# Patient Record
Sex: Female | Born: 2000 | Race: Black or African American | Hispanic: No | Marital: Single | Smoking: Never smoker
Health system: Southern US, Community
[De-identification: ages and names within clinical notes are randomized; demographics above are authoritative.]

---
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
## 2021-09-15 DIAGNOSIS — W228XXA Striking against or struck by other objects, initial encounter: Secondary | ICD-10-CM | POA: Diagnosis not present

## 2021-09-15 DIAGNOSIS — Y9367 Activity, basketball: Secondary | ICD-10-CM | POA: Insufficient documentation

## 2021-09-15 DIAGNOSIS — S0990XA Unspecified injury of head, initial encounter: Secondary | ICD-10-CM | POA: Diagnosis present

## 2021-09-15 DIAGNOSIS — M542 Cervicalgia: Secondary | ICD-10-CM | POA: Insufficient documentation

## 2021-09-15 DIAGNOSIS — R001 Bradycardia, unspecified: Secondary | ICD-10-CM | POA: Diagnosis not present

## 2021-09-16 ENCOUNTER — Emergency Department (HOSPITAL_COMMUNITY)
Admission: EM | Admit: 2021-09-16 | Discharge: 2021-09-16 | Disposition: A | Discharge status: Home / Self Care | Payer: Medicaid Other | Attending: Emergency Medicine | Admitting: Emergency Medicine

## 2021-09-16 ENCOUNTER — Other Ambulatory Visit: Payer: Self-pay

## 2021-09-16 ENCOUNTER — Encounter (HOSPITAL_COMMUNITY): Payer: Self-pay | Admitting: Emergency Medicine

## 2021-09-16 ENCOUNTER — Emergency Department (HOSPITAL_COMMUNITY): Payer: Medicaid Other

## 2021-09-16 DIAGNOSIS — S0990XA Unspecified injury of head, initial encounter: Secondary | ICD-10-CM

## 2021-09-16 MED ORDER — NAPROXEN 500 MG PO TABS: 500.0000 mg | ORAL_TABLET | Freq: Two times a day (BID) | ORAL | 0 refills | Status: DC | PRN

## 2021-09-16 NOTE — ED Triage Notes (Signed)
Patient accidentally hit at back of head while playing basketball yesterday afternoon , denies LOC , reports occipital headache , patient added that her girlfriend told her that she stopped breathing this evening .

## 2021-09-16 NOTE — ED Provider Notes (Signed)
[facility] EMERGENCY DEPARTMENT Provider Note   CSN: [id number] Arrival date & time: 09/15/21  2136     History Chief Complaint  Patient presents with   Head Injury    Desiree Potter is a 20 y.o. female without significant past medical history who presents to the emergency department with complaints of headache and neck pain status post injury that occurred yesterday while she was playing basketball.  She states she was struck in the back of the head/neck, unsure which she was struck with, she states she saw spots and may have had loss of consciousness.  She has been having headache and neck pain with some intermittent blurry vision since.  No alleviating or aggravating factors.  She denies numbness, weakness, vomiting, or seizure activity.  HPI     History reviewed. No pertinent past medical history.  There are no problems to display for this patient.   History reviewed. No pertinent surgical history.   OB History   No obstetric history on file.     No family history on file.  Social History   Tobacco Use   Smoking status: Never   Smokeless tobacco: Never  Substance Use Topics   Alcohol use: Yes   Drug use: Never    Home Medications Prior to Admission medications   Medication Sig Start Date End Date Taking? Authorizing Provider  naproxen (NAPROSYN) 500 MG tablet Take 1 tablet (500 mg total) by mouth 2 (two) times daily as needed for moderate pain. 09/16/21  Yes [place] Hedman R, PA-C    Allergies    Patient has no known allergies.  Review of Systems   Review of Systems  Constitutional:  Negative for chills and fever.  Eyes:  Positive for visual disturbance.  Respiratory:  Negative for shortness of breath.   Cardiovascular:  Negative for chest pain.  Gastrointestinal:  Negative for abdominal pain.  Musculoskeletal:  Positive for neck pain. Negative for back pain.  Neurological:  Positive for headaches. Negative for seizures, weakness  and numbness.  All other systems reviewed and are negative.  Physical Exam Updated Vital Signs BP 128/79   Pulse (!) 45   Temp 97.9 F (36.6 C) (Oral)   Resp 16   Ht 6\' 1"  (1.854 m)   Wt 72 kg   LMP 08/18/2021   SpO2 100%   BMI 20.94 kg/m   Physical Exam Vitals and nursing note reviewed.  Constitutional:      General: She is not in acute distress.    Appearance: Normal appearance. She is not toxic-appearing.  HENT:     Head: Normocephalic and atraumatic.     Mouth/Throat:     Pharynx: Oropharynx is clear. Uvula midline.  Eyes:     General: Vision grossly intact. Gaze aligned appropriately.     Extraocular Movements: Extraocular movements intact.     Conjunctiva/sclera: Conjunctivae normal.     Pupils: Pupils are equal, round, and reactive to light.     Comments: No proptosis.   Cardiovascular:     Rate and Rhythm: Regular rhythm. Bradycardia present.  Pulmonary:     Effort: Pulmonary effort is normal.     Breath sounds: Normal breath sounds.  Abdominal:     General: There is no distension.     Palpations: Abdomen is soft.     Tenderness: There is no abdominal tenderness. There is no guarding or rebound.  Musculoskeletal:     Cervical back: Normal range of motion and neck supple. Tenderness (diffuse midline)  present. No rigidity.  Skin:    General: Skin is warm and dry.  Neurological:     Mental Status: She is alert.     Comments: Alert. Clear speech. No facial droop. CNIII-XII grossly intact. Bilateral upper and lower extremities' sensation grossly intact. 5/5 symmetric strength with grip strength and with plantar and dorsi flexion bilaterally . Normal finger to nose bilaterally. Negative pronator drift. Gait intact.    Psychiatric:        Mood and Affect: Mood normal.        Behavior: Behavior normal.    ED Results / Procedures / Treatments   Labs (all labs ordered are listed, but only abnormal results are displayed) Labs Reviewed - No data to  display  EKG None  Radiology CT Head Wo Contrast  Result Date: 09/16/2021 CLINICAL DATA:  Trauma. EXAM: CT HEAD WITHOUT CONTRAST CT CERVICAL SPINE WITHOUT CONTRAST TECHNIQUE: Multidetector CT imaging of the head and cervical spine was performed following the standard protocol without intravenous contrast. Multiplanar CT image reconstructions of the cervical spine were also generated. COMPARISON:  None. FINDINGS: CT HEAD FINDINGS Brain: No evidence of acute infarction, hemorrhage, hydrocephalus, extra-axial collection or mass lesion/mass effect. Vascular: No hyperdense vessel or unexpected calcification. Skull: Normal. Negative for fracture or focal lesion. Sinuses/Orbits: No acute finding. Other: None CT CERVICAL SPINE FINDINGS Alignment: No acute subluxation. There is reversal of normal cervical lordosis which may be positional or due to muscle spasm. Skull base and vertebrae: No acute fracture. Soft tissues and spinal canal: No prevertebral fluid or swelling. No visible canal hematoma. Disc levels:  No acute findings.  No degenerative changes. Upper chest: Negative. Other: None IMPRESSION: 1. Normal noncontrast CT of the brain. 2. No acute/traumatic cervical spine pathology. 3. Reversal of normal cervical lordosis which may be positional or due to muscle spasm. Electronically Signed   By: Elgie CollardArash  Radparvar M.D.   On: 09/16/2021 01:58   CT Cervical Spine Wo Contrast  Result Date: 09/16/2021 CLINICAL DATA:  Trauma. EXAM: CT HEAD WITHOUT CONTRAST CT CERVICAL SPINE WITHOUT CONTRAST TECHNIQUE: Multidetector CT imaging of the head and cervical spine was performed following the standard protocol without intravenous contrast. Multiplanar CT image reconstructions of the cervical spine were also generated. COMPARISON:  None. FINDINGS: CT HEAD FINDINGS Brain: No evidence of acute infarction, hemorrhage, hydrocephalus, extra-axial collection or mass lesion/mass effect. Vascular: No hyperdense vessel or unexpected  calcification. Skull: Normal. Negative for fracture or focal lesion. Sinuses/Orbits: No acute finding. Other: None CT CERVICAL SPINE FINDINGS Alignment: No acute subluxation. There is reversal of normal cervical lordosis which may be positional or due to muscle spasm. Skull base and vertebrae: No acute fracture. Soft tissues and spinal canal: No prevertebral fluid or swelling. No visible canal hematoma. Disc levels:  No acute findings.  No degenerative changes. Upper chest: Negative. Other: None IMPRESSION: 1. Normal noncontrast CT of the brain. 2. No acute/traumatic cervical spine pathology. 3. Reversal of normal cervical lordosis which may be positional or due to muscle spasm. Electronically Signed   By: Elgie CollardArash  Radparvar M.D.   On: 09/16/2021 01:58    Procedures Procedures   Medications Ordered in ED Medications - No data to display  ED Course  I have reviewed the triage vital signs and the nursing notes.  Pertinent labs & imaging results that were available during my care of the patient were reviewed by me and considered in my medical decision making (see chart for details).    MDM Rules/Calculators/A&P  Patient presents to the ED with complaints of headache and neck pain status post injury yesterday.  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   Additional history obtained:  Additional history obtained from nursing note review.   Imaging Studies ordered:  I ordered imaging studies which included CT head/Cspine, I independently reviewed, formal radiology impression shows:  1. Normal noncontrast CT of the brain. 2. No acute/traumatic cervical spine pathology. 3. Reversal of normal cervical lordosis which may be positional or due to muscle spasm.  ED Course:  No head bleed.  No cervical spine fracture or dislocation.  Patient without focal neurologic deficits on  exam.  She is ambulatory.  Suspect mild traumatic brain injury/concussion.  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 I discussed results, treatment plan, need for follow-up, and return precautions with the patient and her girlfriend at bedside.  Provided opportunity for questions, patient and her girlfriend confirmed understanding and are in agreement with plan.   Portions of this note were generated with Scientist, clinical (histocompatibility and immunogenetics). Dictation errors may occur despite best attempts at proofreading.  Final Clinical Impression(s) / ED Diagnoses Final diagnoses:  Injury of head, initial encounter    Rx / DC Orders ED Discharge Orders          Ordered    naproxen (NAPROSYN) 500 MG tablet  2 times daily PRN        09/16/21 0533             Cherly Anderson, PA-C 09/16/21 4825    Nira Conn, MD 09/16/21 [phone]

## 2021-09-16 NOTE — Discharge Instructions (Addendum)
You were seen in the emergency department today following a head injury.  We suspect that you have a concussion, otherwise known and as a mild traumatic brain injury.  Your  CT scan did not show any new abnormality such as a brain bleed.  We would like you to follow-up with the [facility], contact information below:  Address: [address], [place], [place] [address] Phone: [phone]  Per [facility] Website:   What to Expect: Evaluations at the Concussion Clinic All patients at the Concussion Clinic are given an extensive three-part evaluation that includes: a computerized test to measure memory, visual processing speed, and reaction time a test that measures the systems that integrate movement, balance, and vision an in-depth review of a detailed symptoms checklist for signs of concussion The evaluation process is critical for the treatment and recovery of a concussed patient as no two concussions are alike. Thankfully, the diagnostic tools that trained professionals use can help to better manage head injuries.  Part of the technology the doctors and staff at [facility] use in their assessments is a computerized examination called ImPACT. This tool uses six tasks to measure memory, visual processing speed, and reaction time. By analyzing the results of the examination and comparing them to average responses or a baseline score for a patient, our staff can make an informed judgment about the patient's cognitive functions. In addition to the ImPACT examination, patients are given a Vestibular Ocular Motor Screening test. This is a simple and painless test that focuses on the systems that integrate a patient's movement, balance, and vision.  These tests are used in conjunction with a thorough review of a detailed symptoms checklist to complete the patient's evaluation and develop a treatment plan.  You do not need a referral,  and you can book an appointment online. Our Concussion Hotline is staffed by trained professionals during our regular office hours: Monday - Thursday from 7:30 AM to 4:30 PM, and Fridays from 7:30 AM to 12:00 PM, and the number to call is [phone]. The Concussion Clinic team meets with patients at our [facility] office. Call today.   Further ED Instructions:   Please call and follow-up within the concussion clinic as well as your primary care provider within the next 3 to 5 days.  In the meantime we would like you to avoid strenuous/over exertional activities such as sports or running.  Please avoid excess screen time utilizing cell phones, computers, or the TV.  Please avoid activities that require significant amount of concentration.  Please try to rest as much as possible. We are sending you home with a prescription for naproxen.   - Naproxen is a nonsteroidal anti-inflammatory medication that will help with pain and swelling. Be sure to take this medication as prescribed with food, 1 pill every 12 hours,  It should be taken with food, as it can cause stomach upset, and more seriously, stomach bleeding. Do not take other nonsteroidal anti-inflammatory medications with this such as Advil, Motrin, Aleve, Mobic, Goodie Powder, or Motrin.    You make take Tylenol per over the counter dosing with these medications.   We have prescribed you new medication(s) today. Discuss the medications prescribed today with your pharmacist as they can have adverse effects and interactions with your other medicines including over the counter and prescribed medications. Seek medical evaluation if you start to experience new or abnormal symptoms after taking one of these medicines, seek care  immediately if you start to experience difficulty breathing, feeling of your throat closing, facial swelling, or rash as these could be indications of a more serious allergic reaction    Return to the ER for new or worsening  symptoms or any other concerns that you may have.
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
## 2022-01-13 ENCOUNTER — Other Ambulatory Visit: Payer: Self-pay

## 2022-01-13 ENCOUNTER — Encounter (HOSPITAL_COMMUNITY): Payer: Self-pay

## 2022-01-13 ENCOUNTER — Emergency Department (HOSPITAL_COMMUNITY): Payer: BC Managed Care – PPO

## 2022-01-13 ENCOUNTER — Emergency Department (HOSPITAL_COMMUNITY)
Admission: EM | Admit: 2022-01-13 | Discharge: 2022-01-13 | Disposition: A | Discharge status: Home / Self Care | Payer: BC Managed Care – PPO | Attending: Emergency Medicine | Admitting: Emergency Medicine

## 2022-01-13 DIAGNOSIS — Y9289 Other specified places as the place of occurrence of the external cause: Secondary | ICD-10-CM | POA: Insufficient documentation

## 2022-01-13 DIAGNOSIS — Z23 Encounter for immunization: Secondary | ICD-10-CM | POA: Diagnosis not present

## 2022-01-13 DIAGNOSIS — S59911A Unspecified injury of right forearm, initial encounter: Secondary | ICD-10-CM | POA: Diagnosis present

## 2022-01-13 DIAGNOSIS — W228XXA Striking against or struck by other objects, initial encounter: Secondary | ICD-10-CM | POA: Diagnosis not present

## 2022-01-13 DIAGNOSIS — M79641 Pain in right hand: Secondary | ICD-10-CM

## 2022-01-13 DIAGNOSIS — T22111A Burn of first degree of right forearm, initial encounter: Secondary | ICD-10-CM | POA: Insufficient documentation

## 2022-01-13 DIAGNOSIS — T310 Burns involving less than 10% of body surface: Secondary | ICD-10-CM | POA: Insufficient documentation

## 2022-01-13 MED ORDER — TETANUS-DIPHTH-ACELL PERTUSSIS 5-2.5-18.5 LF-MCG/0.5 IM SUSY
0.5000 mL | PREFILLED_SYRINGE | Freq: Once | INTRAMUSCULAR | Status: AC
  Administered 2022-01-13: 0.5 mL via INTRAMUSCULAR
  Filled 2022-01-13: qty 0.5

## 2022-01-13 MED ORDER — ALBUTEROL SULFATE HFA 108 (90 BASE) MCG/ACT IN AERS: 1.0000 | INHALATION_SPRAY | Freq: Four times a day (QID) | RESPIRATORY_TRACT | 0 refills | Status: AC | PRN
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
## 2022-01-15 ENCOUNTER — Other Ambulatory Visit: Payer: Self-pay

## 2022-01-15 ENCOUNTER — Emergency Department (HOSPITAL_COMMUNITY): Payer: BC Managed Care – PPO

## 2022-01-15 ENCOUNTER — Emergency Department (HOSPITAL_COMMUNITY)
Admission: EM | Admit: 2022-01-15 | Discharge: 2022-01-15 | Disposition: A | Discharge status: Home / Self Care | Payer: BC Managed Care – PPO | Attending: Emergency Medicine | Admitting: Emergency Medicine

## 2022-01-15 ENCOUNTER — Encounter (HOSPITAL_COMMUNITY): Payer: Self-pay

## 2022-01-15 DIAGNOSIS — M25531 Pain in right wrist: Secondary | ICD-10-CM

## 2022-01-15 DIAGNOSIS — W2203XA Walked into furniture, initial encounter: Secondary | ICD-10-CM | POA: Diagnosis not present

## 2022-01-15 DIAGNOSIS — S60221A Contusion of right hand, initial encounter: Secondary | ICD-10-CM | POA: Diagnosis not present

## 2022-01-15 DIAGNOSIS — S6991XA Unspecified injury of right wrist, hand and finger(s), initial encounter: Secondary | ICD-10-CM | POA: Diagnosis present

## 2022-01-15 NOTE — Discharge Instructions (Signed)
Please read and follow all provided instructions. ? ?Your diagnoses today include:  ?1. Contusion of right hand, initial encounter   ?2. Right wrist pain   ? ? ?Tests performed today include: ?An x-ray of the affected area - does NOT show any broken bones ?Vital signs. See below for your results today.  ? ?Medications prescribed:  ?Please use over-the-counter NSAID medications (ibuprofen, naproxen) as directed on the packaging for pain if you do not have any reasons not to take these medications just as weak kidneys or a history of bleeding in your stomach or gut.  ? ?Take any prescribed medications only as directed. ? ?Home care instructions:  ?Follow any educational materials contained in this packet ?Follow R.I.C.E. Protocol: ?R - rest your injury ? I  - use ice on injury without applying directly to skin ?C - compress injury with bandage or splint ?E - elevate the injury as much as possible ? ?Follow-up instructions: ?Please follow-up with your primary care provider if you continue to have significant pain in 1 week. In this case you may have a more severe injury that requires further care.  ? ?Return instructions:  ?Please return if your fingers are numb or tingling, appear gray or blue, or you have severe pain (also elevate the arm and loosen splint or wrap if you were given one) ?Please return to the Emergency Department if you experience worsening symptoms.  ?Please return if you have any other emergent concerns. ? ?Additional Information: ? ?Your vital signs today were: ?BP (!) 141/94 (BP Location: Left Arm)   Pulse 71   Temp 98 ?F (36.7 ?C) (Oral)   Resp 18   Ht 6\' 1"  (1.854 m)   Wt 63.5 kg   LMP 12/31/2021   SpO2 98%   BMI 18.47 kg/m?  ?If your blood pressure (BP) was elevated above 135/85 this visit, please have this repeated by your doctor within one month. ?-------------- ? ? ?
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
## 2022-03-12 ENCOUNTER — Emergency Department (HOSPITAL_COMMUNITY): Payer: BC Managed Care – PPO

## 2022-03-12 ENCOUNTER — Other Ambulatory Visit: Payer: Self-pay

## 2022-03-12 ENCOUNTER — Emergency Department (HOSPITAL_COMMUNITY)
Admission: EM | Admit: 2022-03-12 | Discharge: 2022-03-12 | Disposition: A | Discharge status: Home / Self Care | Payer: BC Managed Care – PPO | Attending: Emergency Medicine | Admitting: Emergency Medicine

## 2022-03-12 ENCOUNTER — Encounter (HOSPITAL_COMMUNITY): Payer: Self-pay

## 2022-03-12 DIAGNOSIS — S60511A Abrasion of right hand, initial encounter: Secondary | ICD-10-CM | POA: Insufficient documentation

## 2022-03-12 DIAGNOSIS — S6991XA Unspecified injury of right wrist, hand and finger(s), initial encounter: Secondary | ICD-10-CM | POA: Diagnosis present

## 2022-03-12 DIAGNOSIS — W25XXXA Contact with sharp glass, initial encounter: Secondary | ICD-10-CM | POA: Diagnosis not present

## 2022-03-12 DIAGNOSIS — S60221A Contusion of right hand, initial encounter: Secondary | ICD-10-CM

## 2022-03-12 MED ORDER — IBUPROFEN 800 MG PO TABS
800.0000 mg | ORAL_TABLET | Freq: Once | ORAL | Status: AC
  Administered 2022-03-12: 800 mg via ORAL
  Filled 2022-03-12: qty 1

## 2022-03-12 NOTE — Discharge Instructions (Signed)
You have a contusion and abrasion of the right hand.  There is no fracture on the x-ray right now. ? ?Take Tylenol or Motrin for pain and apply ice for swelling. ? ?Return to ER if you have worse hand swelling, uncontrolled bleeding ?
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
## 2022-04-17 ENCOUNTER — Emergency Department (HOSPITAL_COMMUNITY)
Admission: EM | Admit: 2022-04-17 | Discharge: 2022-04-17 | Disposition: A | Discharge status: Home / Self Care | Payer: BC Managed Care – PPO | Attending: Emergency Medicine | Admitting: Emergency Medicine

## 2022-04-17 ENCOUNTER — Emergency Department (HOSPITAL_COMMUNITY): Payer: BC Managed Care – PPO

## 2022-04-17 ENCOUNTER — Encounter (HOSPITAL_COMMUNITY): Payer: Self-pay | Admitting: Emergency Medicine

## 2022-04-17 DIAGNOSIS — W228XXA Striking against or struck by other objects, initial encounter: Secondary | ICD-10-CM | POA: Insufficient documentation

## 2022-04-17 DIAGNOSIS — M79674 Pain in right toe(s): Secondary | ICD-10-CM | POA: Diagnosis present

## 2022-04-17 MED ORDER — IBUPROFEN 600 MG PO TABS: 600.0000 mg | ORAL_TABLET | Freq: Four times a day (QID) | ORAL | 0 refills | Status: DC | PRN

## 2022-04-17 MED ORDER — IBUPROFEN 800 MG PO TABS
800.0000 mg | ORAL_TABLET | Freq: Once | ORAL | Status: AC
  Administered 2022-04-17: 800 mg via ORAL
  Filled 2022-04-17: qty 1

## 2022-04-17 MED ORDER — IBUPROFEN 800 MG PO TABS: 800.0000 mg | ORAL_TABLET | Freq: Three times a day (TID) | ORAL | 0 refills | Status: DC

## 2022-04-17 NOTE — Discharge Instructions (Addendum)
Take ibuprofen every 6 hours for the next 7 days.  Keep foot in hard soled shoe for 7 to 10 days and reassess pain level.  Rest, ice, elevate, compress affected toe.  I will provide information follow-up with orthopedics if you need or if your symptoms do not get better or get worse.  Also provide a work note as requested.  Please do not hesitate to return to the emergency department for worrisome signs and symptoms we discussed become apparent.

## 2022-04-17 NOTE — ED Provider Notes (Signed)
[facility]-EMERGENCY DEPT Provider Note   CSN: [id number] Arrival date & time: 04/17/22  1348     History  Chief Complaint  Patient presents with   Toe Pain    Desiree Potter is a 21 y.o. female.   Toe Pain Pertinent negatives include no chest pain, no abdominal pain and no shortness of breath.  21 year old female presents emergency department with right great toe pain.  Patient states that she was kicking a ball yesterday when she missed the ball and kicked a wall.  Her toe proceeded to swell and become bruised.  She has been able to ambulate but with pain.  She has tried to take one 800 mg of Advil since the accident with some relief.  She denies weakness, sensory deficits. Denies fever, chills, night sweats, chest pain, shortness of breath, abdominal pain, N/V/D, urinary/vaginal symptoms, change in bowel habits.  History reviewed. No pertinent past medical history.  History reviewed. No pertinent surgical history.  Home Medications Prior to Admission medications   Medication Sig Start Date End Date Taking? Authorizing Provider  ibuprofen (ADVIL) 600 MG tablet Take 1 tablet (600 mg total) by mouth every 6 (six) hours as needed. 04/17/22  Yes Sherian Maroonobbins, Nirali Magouirk A, PA  albuterol (VENTOLIN HFA) 108 (90 Base) MCG/ACT inhaler Inhale 1-2 puffs into the lungs every 6 (six) hours as needed for wheezing or shortness of breath. 01/13/22   Haskel SchroederBadalamente, Peter R, PA-C  naproxen (NAPROSYN) 500 MG tablet Take 1 tablet (500 mg total) by mouth 2 (two) times daily as needed for moderate pain. 09/16/21   Petrucelli, Pleas KochSamantha R, PA-C      Allergies    Patient has no known allergies.    Review of Systems   Review of Systems  Constitutional:  Negative for chills and fever.  HENT:  Negative for ear pain and sore throat.   Eyes:  Negative for pain and visual disturbance.  Respiratory:  Negative for cough and shortness of breath.   Cardiovascular:  Negative for chest pain and  palpitations.  Gastrointestinal:  Negative for abdominal pain and vomiting.  Genitourinary:  Negative for dysuria and hematuria.  Musculoskeletal:  Positive for arthralgias. Negative for back pain.       Right great toe pain and swelling.  Skin:  Negative for color change and rash.  Neurological:  Negative for seizures and syncope.  All other systems reviewed and are negative.   Physical Exam Updated Vital Signs BP 115/64   Pulse (!) 51   Temp 98.1 F (36.7 C)   Resp 16   SpO2 99%  Physical Exam Vitals and nursing note reviewed.  Constitutional:      General: She is not in acute distress.    Appearance: She is well-developed.  HENT:     Head: Normocephalic and atraumatic.     Mouth/Throat:     Mouth: Mucous membranes are moist.     Pharynx: Oropharynx is clear.  Eyes:     Extraocular Movements: Extraocular movements intact.     Conjunctiva/sclera: Conjunctivae normal.  Cardiovascular:     Rate and Rhythm: Normal rate and regular rhythm.     Heart sounds: No murmur heard. Pulmonary:     Effort: Pulmonary effort is normal. No respiratory distress.     Breath sounds: Normal breath sounds.  Abdominal:     General: Abdomen is flat.     Palpations: Abdomen is soft.     Tenderness: There is no abdominal tenderness.  Musculoskeletal:  Cervical back: Normal range of motion and neck supple.     Left foot: Normal.     Comments: Swelling located along the length of the great toe.  Ecchymosis noted at the base.  Minimal swelling extending into the forefoot.  Tenderness to palpation along the aspect of the great toe.  Minimal active range of motion in flexion and extension of great toe secondary to pain.  Patient has full range of motion of ankles bilaterally.  Posterior tibial pulses full and intact bilaterally.  No sensory deficits along major nerve distributions of lower extremities.  Muscle strength 5 out of 5.  Cap refill less than 2 seconds on affected digit  Skin:     General: Skin is warm and dry.     Capillary Refill: Capillary refill takes less than 2 seconds.  Neurological:     Mental Status: She is alert.  Psychiatric:        Mood and Affect: Mood normal.     ED Results / Procedures / Treatments   Labs (all labs ordered are listed, but only abnormal results are displayed) Labs Reviewed - No data to display  EKG None  Radiology DG Foot Complete Right  Result Date: 04/17/2022 CLINICAL DATA:  Right great toe pain EXAM: RIGHT FOOT COMPLETE - 3+ VIEW COMPARISON:  None Available. FINDINGS: There is no evidence of fracture or dislocation. There is no evidence of arthropathy or other focal bone abnormality. Soft tissues are unremarkable. IMPRESSION: No fracture or dislocation of the right foot. Joint spaces are well preserved. No radiographic findings to explain pain. Electronically Signed   By: Jearld Lesch M.D.   On: 04/17/2022 14:50    Procedures Procedures    Medications Ordered in ED Medications  ibuprofen (ADVIL) tablet 800 mg (800 mg Oral Given 04/17/22 1459)    ED Course/ Medical Decision Making/ A&P                           Medical Decision Making Amount and/or Complexity of Data Reviewed Radiology: ordered.  Risk Prescription drug management.   This patient presents to the ED for concern of great toe pain, this involves an extensive number of treatment options, and is a complaint that carries with it a high risk of complications and morbidity.  The differential diagnosis includes fracture, contusion, osteomyelitis, gout, RA,   Co morbidities that complicate the patient evaluation  N/a   Additional history obtained:  Additional history obtained from friend who is at bedside   Lab Tests:  N/a   Imaging Studies ordered:  I ordered imaging studies including right foot x-ray I independently visualized and interpreted imaging which showed no acute abnormality I agree with the radiologist interpretation   Cardiac  Monitoring: / EKG:  The patient was maintained on a cardiac monitor.  I personally viewed and interpreted the cardiac monitored which showed an underlying rhythm of: Sinus rhythm   Consultations Obtained:  N/a   Problem List / ED Course / Critical interventions / Medication management  Toe pain I ordered medication including Motrin for pain   Reevaluation of the patient after these medicines showed that the patient improved I have reviewed the patients home medicines and have made adjustments as needed   Social Determinants of Health:  Denies tobacco, alcohol, illicit drug use   Test / Admission - Considered:  Toe pain Vitals signs within normal range and stable throughout visit. imaging studies significant for: No acute abnormality  Likely secondary to turf toe given negative injury with likely fracture.  Recommend symptomatic therapy with rest, ice, compression, elevation, NSAIDs for the next 7 days and get response.  Hard soled shoe also provided to help with symptomatic control.  Ortho follow-up in 7 to 10 days if symptoms do not get better or get worse. Worrisome signs and symptoms were discussed with the patient, and the patient acknowledged understanding to return to the ED if noticed. Patient was stable upon discharge.          Final Clinical Impression(s) / ED Diagnoses Final diagnoses:  Toe pain, right    Rx / DC Orders ED Discharge Orders          Ordered    ibuprofen (ADVIL) 800 MG tablet  3 times daily,   Status:  Discontinued        04/17/22 1454    ibuprofen (ADVIL) 600 MG tablet  Every 6 hours PRN        04/17/22 1509              Peter Garter, [place] 04/17/22 1516    Milagros Loll, MD 04/19/22 [phone]

## 2022-04-17 NOTE — ED Triage Notes (Signed)
Patient c/o R great toe pain after hitting it on wall x2 days ago. Bruising noted to toe.

## 2022-04-17 NOTE — Progress Notes (Signed)
Orthopedic Tech Progress Note Patient Details:  Desiree Potter 12/09/2000 [id number]  Ortho Devices Type of Ortho Device: Postop shoe/boot Ortho Device/Splint Location: RLE Ortho Device/Splint Interventions: Application   Post Interventions Patient Tolerated: Well  [name] [name] 04/17/2022, 3:05 PM

## 2022-07-06 ENCOUNTER — Ambulatory Visit (HOSPITAL_COMMUNITY)
Admission: EM | Admit: 2022-07-06 | Discharge: 2022-07-06 | Disposition: A | Discharge status: Home / Self Care | Payer: BC Managed Care – PPO | Attending: Internal Medicine | Admitting: Internal Medicine

## 2022-07-06 ENCOUNTER — Encounter (HOSPITAL_COMMUNITY): Payer: Self-pay

## 2022-07-06 ENCOUNTER — Ambulatory Visit (INDEPENDENT_AMBULATORY_CARE_PROVIDER_SITE_OTHER): Payer: BC Managed Care – PPO

## 2022-07-06 DIAGNOSIS — R079 Chest pain, unspecified: Secondary | ICD-10-CM | POA: Diagnosis not present

## 2022-07-06 DIAGNOSIS — R0789 Other chest pain: Secondary | ICD-10-CM

## 2022-07-06 DIAGNOSIS — R059 Cough, unspecified: Secondary | ICD-10-CM | POA: Diagnosis not present

## 2022-07-06 DIAGNOSIS — R509 Fever, unspecified: Secondary | ICD-10-CM | POA: Diagnosis not present

## 2022-07-06 MED ORDER — KETOROLAC TROMETHAMINE 30 MG/ML IJ SOLN
INTRAMUSCULAR | Status: AC
  Filled 2022-07-06: qty 1

## 2022-07-06 MED ORDER — KETOROLAC TROMETHAMINE 30 MG/ML IJ SOLN
15.0000 mg | Freq: Once | INTRAMUSCULAR | Status: AC
  Administered 2022-07-06: 15 mg via INTRAVENOUS

## 2022-07-06 MED ORDER — IBUPROFEN 600 MG PO TABS: 600.0000 mg | ORAL_TABLET | Freq: Four times a day (QID) | ORAL | 0 refills | Status: AC | PRN

## 2022-07-06 NOTE — Discharge Instructions (Signed)
Heating pad use on a 20-minute on-20 minutes off cycle Gentle stretching exercises Please take medications as prescribed Chest x-ray is negative for pneumonia Return to urgent care if symptoms worsen.

## 2022-07-06 NOTE — ED Triage Notes (Signed)
Pt is here for cough, night sweats, dizziness possible fever spasms under rib cage on right side

## 2022-07-07 NOTE — ED Provider Notes (Signed)
[facility]    CSN: [id number] Arrival date & time: 07/06/22  1410      History   Chief Complaint Chief Complaint  Patient presents with   Cough    HPI Desiree Potter is a 21 y.o. female comes to the urgent care with fairly sudden onset right-sided chest pain of 1 day duration.  Pain is sharp, aggravated by movement and taking a deep breath.  No known relieving factors.  No fall.  Patient has a cough,some night sweats and dizziness.  She denies subjective fever or chills.  No weight loss.  Patient denies any sore throat, runny nose, nausea vomiting or diarrhea.  No sick contacts.  Patient denies any heavy lifting.   HPI  History reviewed. No pertinent past medical history.  There are no problems to display for this patient.   History reviewed. No pertinent surgical history.  OB History   No obstetric history on file.      Home Medications    Prior to Admission medications   Medication Sig Start Date End Date Taking? Authorizing Provider  ibuprofen (ADVIL) 600 MG tablet Take 1 tablet (600 mg total) by mouth every 6 (six) hours as needed. 07/06/22  Yes Elie Leppo, Britta Mccreedy, MD  albuterol (VENTOLIN HFA) 108 (90 Base) MCG/ACT inhaler Inhale 1-2 puffs into the lungs every 6 (six) hours as needed for wheezing or shortness of breath. 01/13/22   Haskel Schroeder, PA-C    Family History History reviewed. No pertinent family history.  Social History Social History   Tobacco Use   Smoking status: Never   Smokeless tobacco: Never  Substance Use Topics   Alcohol use: Yes   Drug use: Never     Allergies   Patient has no allergy information on record.   Review of Systems Review of Systems As per HPI  Physical Exam Triage Vital Signs ED Triage Vitals  Enc Vitals Group     BP 07/06/22 1510 130/75     Pulse Rate 07/06/22 1510 82     Resp 07/06/22 1510 16     Temp 07/06/22 1510 98.4 F (36.9 C)     Temp Source 07/06/22 1510 Oral     SpO2 07/06/22 1510  98 %     Weight 07/06/22 1509 140 lb (63.5 kg)     Height 07/06/22 1509 6\' 2"  (1.88 m)     Head Circumference --      Peak Flow --      Pain Score 07/06/22 1508 8     Pain Loc --      Pain Edu? --      Excl. in GC? --    No data found.  Updated Vital Signs BP 130/75 (BP Location: Right Arm)   Pulse 82   Temp 98.4 F (36.9 C) (Oral)   Resp 16   Ht 6\' 2"  (1.88 m)   Wt 63.5 kg   LMP  (LMP Unknown)   SpO2 98%   BMI 17.97 kg/m   Visual Acuity Right Eye Distance:   Left Eye Distance:   Bilateral Distance:    Right Eye Near:   Left Eye Near:    Bilateral Near:     Physical Exam Vitals and nursing note reviewed.  Constitutional:      General: She is in acute distress.     Appearance: She is not ill-appearing.  Cardiovascular:     Rate and Rhythm: Normal rate and regular rhythm.     Pulses: Normal  pulses.     Heart sounds: Normal heart sounds.  Pulmonary:     Effort: Pulmonary effort is normal.     Breath sounds: Normal breath sounds.  Abdominal:     General: Bowel sounds are normal.     Palpations: Abdomen is soft.  Skin:    General: Skin is warm.  Neurological:     Mental Status: She is alert.      UC Treatments / Results  Labs (all labs ordered are listed, but only abnormal results are displayed) Labs Reviewed - No data to display  EKG   Radiology DG Chest 2 View  Result Date: 07/06/2022 CLINICAL DATA:  Chest pain, fever, cough EXAM: CHEST - 2 VIEW COMPARISON:  08/17/2019 FINDINGS: The heart size and mediastinal contours are within normal limits. Both lungs are clear. The visualized skeletal structures are unremarkable. IMPRESSION: No active cardiopulmonary disease. Electronically Signed   By: Ernie Avena M.D.   On: 07/06/2022 15:38    Procedures Procedures (including critical care time)  Medications Ordered in UC Medications  ketorolac (TORADOL) 30 MG/ML injection 15 mg (15 mg Intravenous Given 07/06/22 1609)    Initial Impression /  Assessment and Plan / UC Course  I have reviewed the triage vital signs and the nursing notes.  Pertinent labs & imaging results that were available during my care of the patient were reviewed by me and considered in my medical decision making (see chart for details).     1.  Chest wall pain: Toradol 15 mg IM x1 dose Chest x-ray is negative for acute lung infiltrates Ibuprofen as needed for pain Heating pad use as directed Gentle stretching exercises Maintain adequate hydration Return precautions given. Final Clinical Impressions(s) / UC Diagnoses   Final diagnoses:  Chest wall pain     Discharge Instructions      Heating pad use on a 20-minute on-20 minutes off cycle Gentle stretching exercises Please take medications as prescribed Chest x-ray is negative for pneumonia Return to urgent care if symptoms worsen.   ED Prescriptions     Medication Sig Dispense Auth. Provider   ibuprofen (ADVIL) 600 MG tablet Take 1 tablet (600 mg total) by mouth every 6 (six) hours as needed. 30 tablet Liz Pinho, Britta Mccreedy, MD      PDMP not reviewed this encounter.   Merrilee Jansky, MD 07/07/22 [phone]

## 2022-08-19 ENCOUNTER — Encounter (HOSPITAL_BASED_OUTPATIENT_CLINIC_OR_DEPARTMENT_OTHER): Payer: Self-pay

## 2022-08-19 ENCOUNTER — Emergency Department (HOSPITAL_BASED_OUTPATIENT_CLINIC_OR_DEPARTMENT_OTHER): Payer: BC Managed Care – PPO | Admitting: Radiology

## 2022-08-19 ENCOUNTER — Emergency Department (HOSPITAL_BASED_OUTPATIENT_CLINIC_OR_DEPARTMENT_OTHER)
Admission: EM | Admit: 2022-08-19 | Discharge: 2022-08-19 | Disposition: A | Discharge status: Home / Self Care | Payer: BC Managed Care – PPO | Attending: Emergency Medicine | Admitting: Emergency Medicine

## 2022-08-19 ENCOUNTER — Other Ambulatory Visit: Payer: Self-pay

## 2022-08-19 DIAGNOSIS — X58XXXA Exposure to other specified factors, initial encounter: Secondary | ICD-10-CM | POA: Diagnosis not present

## 2022-08-19 DIAGNOSIS — S60221A Contusion of right hand, initial encounter: Secondary | ICD-10-CM | POA: Insufficient documentation

## 2022-08-19 DIAGNOSIS — S6991XA Unspecified injury of right wrist, hand and finger(s), initial encounter: Secondary | ICD-10-CM | POA: Diagnosis present

## 2022-08-19 NOTE — ED Triage Notes (Signed)
Patient here POV from School.  Endorses Punching a Wall and is now having Swelling to Right Hand (Proximal Fifth Digit).  NAD Noted during Triage. A&Ox4. GCS 15. Ambulatory.

## 2022-08-19 NOTE — Discharge Instructions (Addendum)
You were seen in the ED regarding injury to your right hand.  Your x-rays are reassuring for no obvious fracture.  Your hand was wrapped with an Ace wrap to help with swelling and pain.  You can take over-the-counter Tylenol or ibuprofen as needed for pain and swelling.  You can also apply ice to the area, I recommend for no longer than 20 minutes at a time.  If your symptoms do not improve please follow-up with PCP.  I recommend avoiding overuse of your hand or playing sports.  I have also included information for the behavioral health center and behavioral health urgent care.  I highly recommend following up with behavioral health regarding how you have been feeling lately.  You can walk into the behavioral health urgent care and receive care at any time without an appointment.  Return to the ED if you develop new or worsening symptoms.

## 2022-08-19 NOTE — ED Provider Notes (Signed)
[facility] EMERGENCY DEPT Provider Note   CSN: [id number] Arrival date & time: 08/19/22  1441     History  Chief Complaint  Patient presents with   Hand Injury    Desiree Potter is a 21 y.o. female presents to the ED with right hand injury after punching a concrete wall.  Patient states she was upset due to finding out her girlfriend was cheating on her.  This led to patient punching the wall 1 time.  She complains of pain to her right hand at the fifth digit and proximally.  Denies numbness, tingling, wound, or other injury.  She states she has been having mental health problems that lead her to self-harm by cutting.  She admits cutting her right thigh today.  Denies cutting through skin or bleeding in these areas.  She is not currently seeing anyone with behavioral health and has not discussed these problems with her PCP.  Patient is a Archivist.  Denies SI/HI.  Denies drug or alcohol use.      Home Medications Prior to Admission medications   Medication Sig Start Date End Date Taking? Authorizing Provider  albuterol (VENTOLIN HFA) 108 (90 Base) MCG/ACT inhaler Inhale 1-2 puffs into the lungs every 6 (six) hours as needed for wheezing or shortness of breath. 01/13/22   Haskel Schroeder, PA-C  ibuprofen (ADVIL) 600 MG tablet Take 1 tablet (600 mg total) by mouth every 6 (six) hours as needed. 07/06/22   Lamptey, Britta Mccreedy, MD      Allergies    Patient has no known allergies.    Review of Systems   Review of Systems  Musculoskeletal:  Positive for joint swelling.       Pain to the right hand proximal to 5th digit  Skin:  Negative for wound.  Neurological:  Negative for numbness.  Psychiatric/Behavioral:  Positive for self-injury. Negative for behavioral problems, hallucinations and suicidal ideas. The patient is nervous/anxious.     Physical Exam Updated Vital Signs BP 119/62 (BP Location: Right Arm)   Pulse 82   Temp 98.4 F (36.9 C) (Temporal)    Resp 18   Ht 6\' 2"  (1.88 m)   Wt 63.5 kg   SpO2 100%   BMI 17.97 kg/m  Physical Exam Vitals and nursing note reviewed.  Constitutional:      General: She is not in acute distress.    Appearance: Normal appearance. She is not ill-appearing or diaphoretic.  Cardiovascular:     Rate and Rhythm: Normal rate and regular rhythm.  Pulmonary:     Effort: Pulmonary effort is normal.  Musculoskeletal:     Right hand: Swelling, tenderness and bony tenderness present. No deformity or lacerations. Normal strength. Normal capillary refill. Normal pulse.     Left hand: Normal.     Comments: Tenderness over 5th metacarpal and MCP joint with swelling.  Skin:    General: Skin is warm and dry.     Capillary Refill: Capillary refill takes less than 2 seconds.     Findings: Signs of injury present.     Comments: Superficial scratches in lines to anterior surface of right thigh.  No bleeding present. Lines of scars to left forearm.  Neurological:     Mental Status: She is alert. Mental status is at baseline.  Psychiatric:        Attention and Perception: Attention normal.        Mood and Affect: Mood is anxious. Affect is tearful.  Speech: Speech normal.        Behavior: Behavior normal. Behavior is cooperative.        Thought Content: Thought content normal. Thought content does not include homicidal or suicidal ideation. Thought content does not include homicidal or suicidal plan.     Comments: She is upset and tearful and states her feelings are due to problems in her relationship.       ED Results / Procedures / Treatments   Labs (all labs ordered are listed, but only abnormal results are displayed) Labs Reviewed - No data to display  EKG None  Radiology DG Hand Complete Right  Result Date: 08/19/2022 CLINICAL DATA:  Right hand swelling after punching a wall. EXAM: RIGHT HAND - COMPLETE 3+ VIEW COMPARISON:  Radiographs 03/12/2022. FINDINGS: The mineralization and alignment are  normal. There is no evidence of acute fracture or dislocation. The joint spaces are preserved. Soft tissue swelling again noted over the 5th metacarpal phalangeal joint. No foreign body or soft tissue emphysema. IMPRESSION: No acute osseous findings. Soft tissue swelling over the 5th MCP joint. Electronically Signed   By: Richardean Sale M.D.   On: 08/19/2022 15:16    Procedures Procedures    Medications Ordered in ED Medications - No data to display  ED Course/ Medical Decision Making/ A&P                           Medical Decision Making  Patient presents to ED with injury to the right hand and tenderness following punching a concrete wall.  She has been experiencing behavioral health problems that lead her to self-harm as well as react and punch walls.  She is not currently under the care of behavioral health or a counselor.  Denies SI/HI.  Differential diagnoses of injury to right hand include but are not limited to boxer's fracture, dislocation, soft tissue injury.   Patient exam shows exquisite tenderness over the fifth metacarpal and fifth MCP joint of the right hand.  Patient is able to make a fist and ROM is intact for all digits and wrist.  Swelling and ecchymosis present over the fifth MCP joint of the right hand.  Spoke with patient about mental health problems that led to injury today.  Patient admits to self-harm but continues to deny SI/HI.  Evidence of self-harm present to right thigh and scars were present on left forearm.  I ordered and personally reviewed images of right hand.  No evidence of osseous fracture.  Soft tissue swelling over fifth MCP.  I agree with radiologist interpretation.  Plan is to discharge patient with Ace wrap on right hand and wrist.  Will provide patient instructions on caring for injury, using ice for swelling, and use of NSAIDs.  Will provide patient with behavioral health resources and information regarding behavioral health urgent care.  I do feel  because patient continues to deny SI/HI, has appropriate behavior, and normal thought content, it is appropriate to discharge her at this time with information on resources.  I strongly recommend patient to follow-up with one of the services or counseling at school.  Strict return precautions were given.          Final Clinical Impression(s) / ED Diagnoses Final diagnoses:  Contusion of right hand, initial encounter    Rx / DC Orders ED Discharge Orders     None         Pat Kocher, [place] 08/19/22 1904  Derwood Kaplan, MD 08/27/22 [phone]

## 2022-08-19 NOTE — ED Notes (Signed)
Reviewed AVS/discharge instruction with patient. Time allotted for and all questions answered. Patient is agreeable for d/c and escorted to ed exit by staff.  

## 2022-08-27 IMAGING — CR DG FOOT COMPLETE 3+V*R*
3 series · 3 of 3 positions shown · non-contrast
Comparison: None Available.

CLINICAL DATA: Right great toe pain

EXAM:
RIGHT FOOT COMPLETE - 3+ VIEW

[x foot ap right]
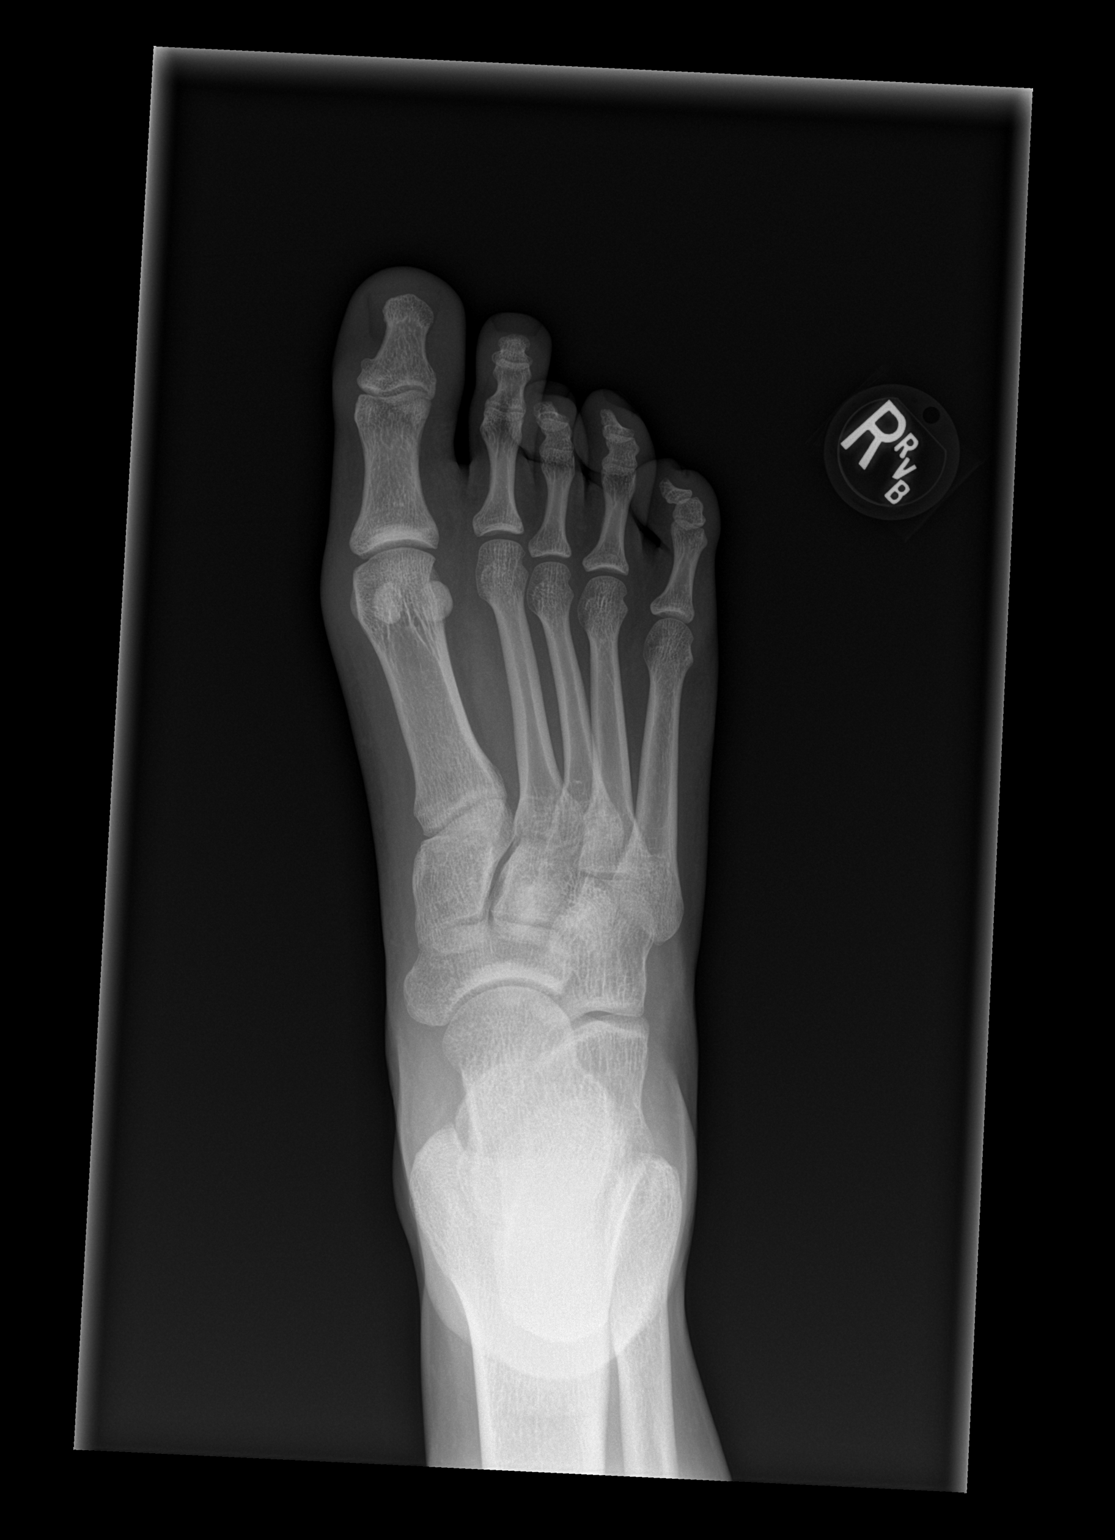

[x foot obl right]
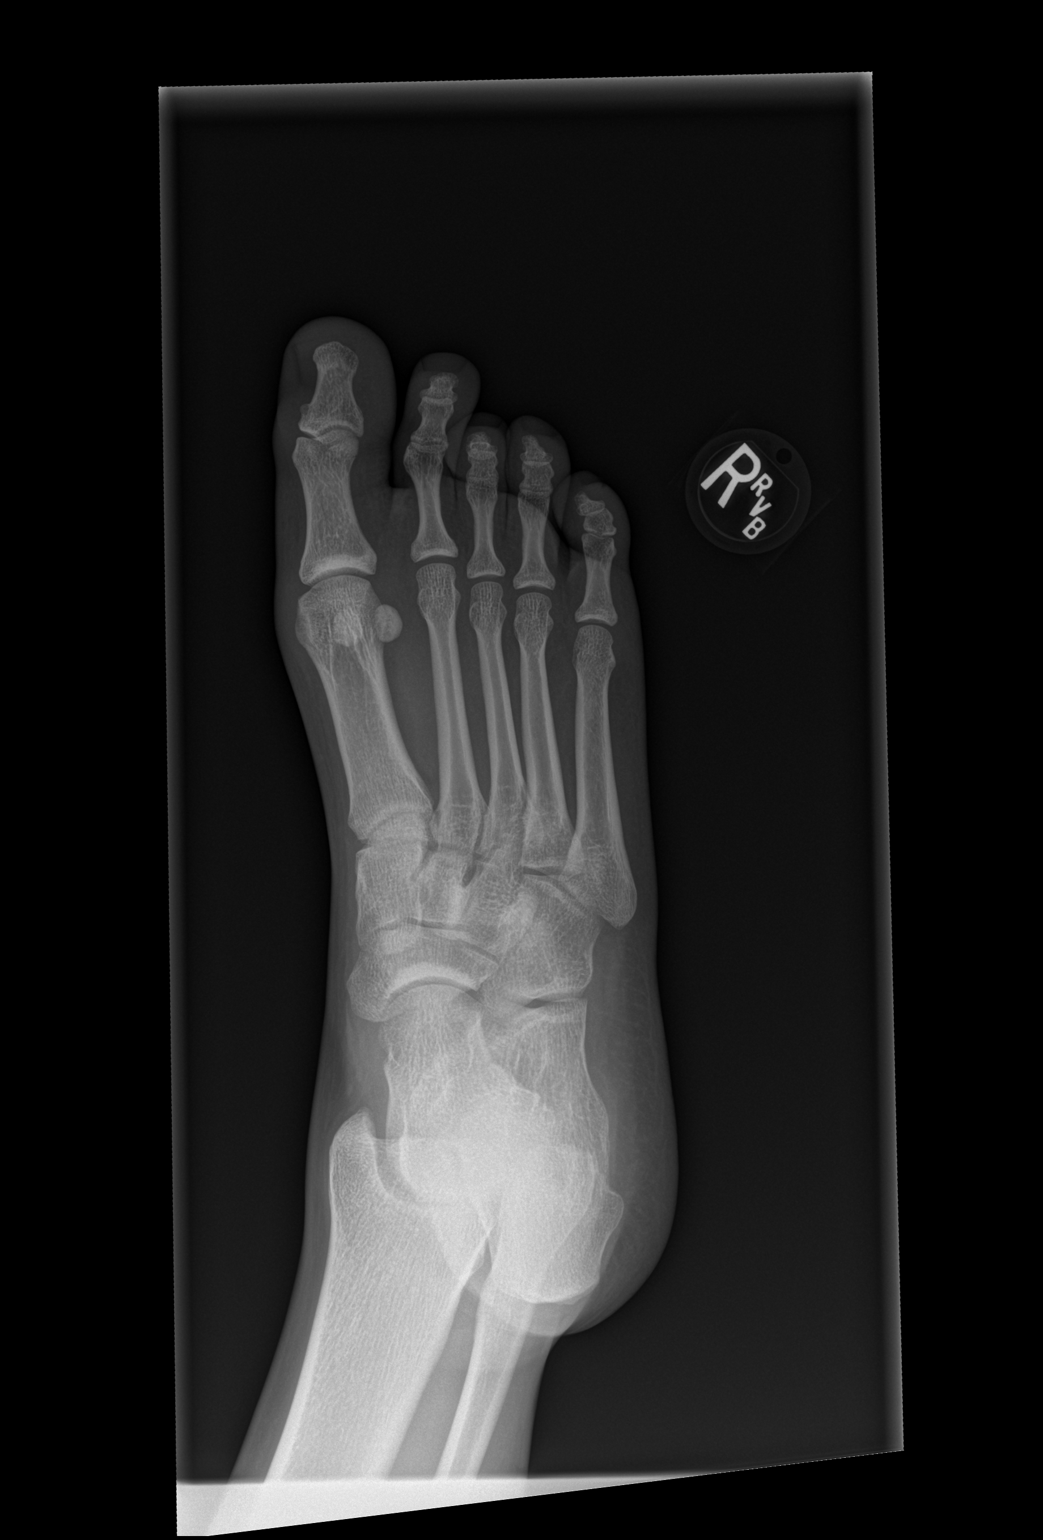

[x foot lat right]
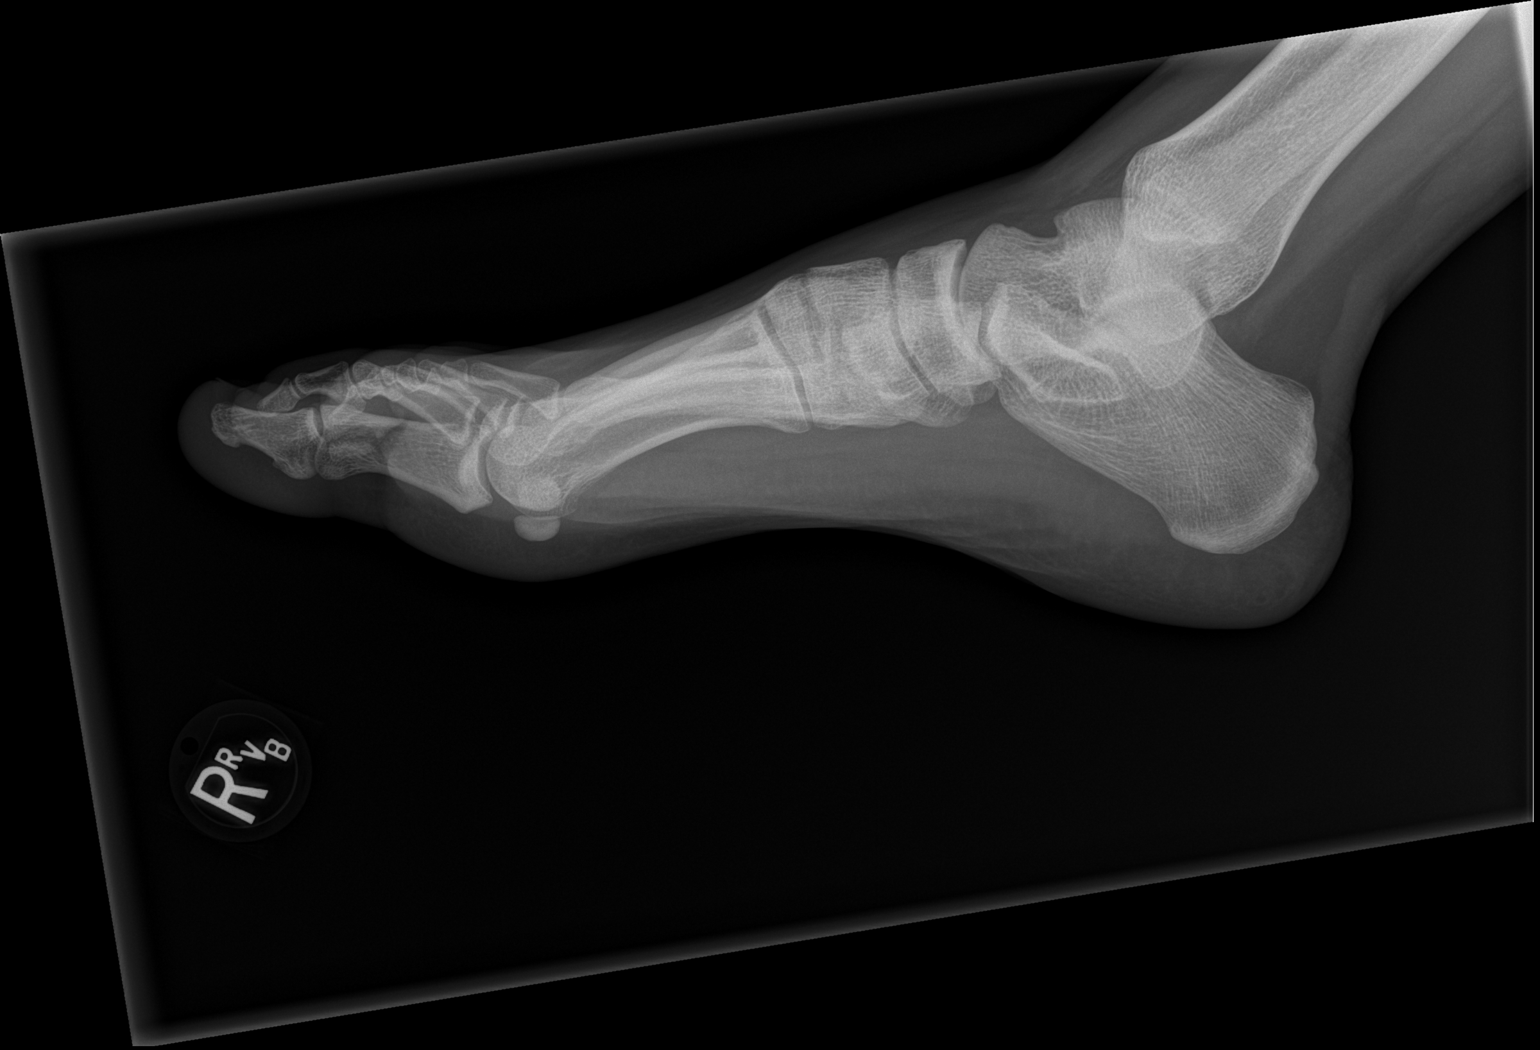

[3 of 3 positions shown; findings below may reference images not displayed]

FINDINGS: There is no evidence of fracture or dislocation. There is no
evidence of arthropathy or other focal bone abnormality. Soft
tissues are unremarkable.
IMPRESSION: No fracture or dislocation of the right foot. Joint spaces are well
preserved. No radiographic findings to explain pain.
